# Patient Record
Sex: Female | Born: 1985 | Race: White | Hispanic: No | Marital: Married | Smoking: Former smoker
Health system: Southern US, Community
[De-identification: ages and names within clinical notes are randomized; demographics above are authoritative.]

## PROBLEM LIST (undated history)

## (undated) DIAGNOSIS — M359 Systemic involvement of connective tissue, unspecified: Secondary | ICD-10-CM

## (undated) DIAGNOSIS — D849 Immunodeficiency, unspecified: Secondary | ICD-10-CM

## (undated) DIAGNOSIS — I10 Essential (primary) hypertension: Secondary | ICD-10-CM

## (undated) HISTORY — DX: Systemic involvement of connective tissue, unspecified: M35.9

## (undated) HISTORY — DX: Essential (primary) hypertension: I10

## (undated) HISTORY — PX: TYMPANOSTOMY TUBE PLACEMENT: SHX32

---

## 2008-11-24 ENCOUNTER — Emergency Department (HOSPITAL_COMMUNITY): Admission: EM | Admit: 2008-11-24 | Discharge: 2008-11-24 | Payer: Self-pay | Admitting: Emergency Medicine

## 2008-12-22 ENCOUNTER — Emergency Department (HOSPITAL_COMMUNITY): Admission: EM | Admit: 2008-12-22 | Discharge: 2008-12-22 | Payer: Self-pay | Admitting: Emergency Medicine

## 2009-11-21 IMAGING — US US EXTREM LOW VENOUS*R*
1 series · 14 of 24 positions shown · non-contrast
Comparison: None.

CLINICAL DATA: Right leg pain



[Series 1: us extrem low venous*right* · 14 of 34 slices shown]
[im 1/34]
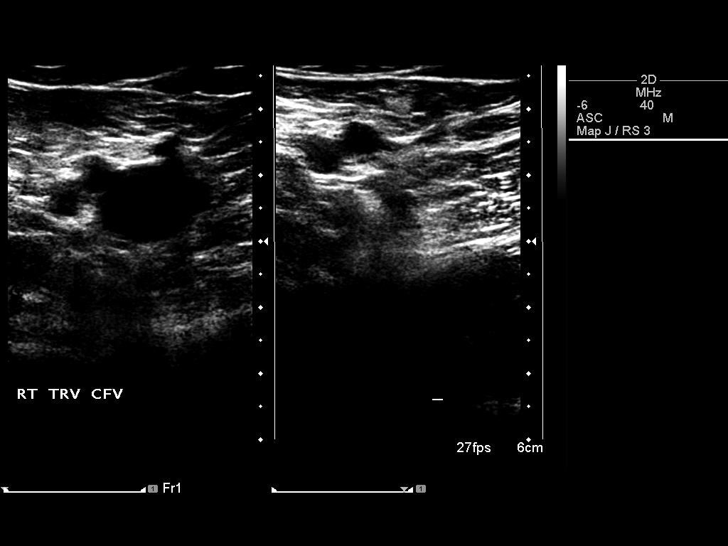
[im 3/34]
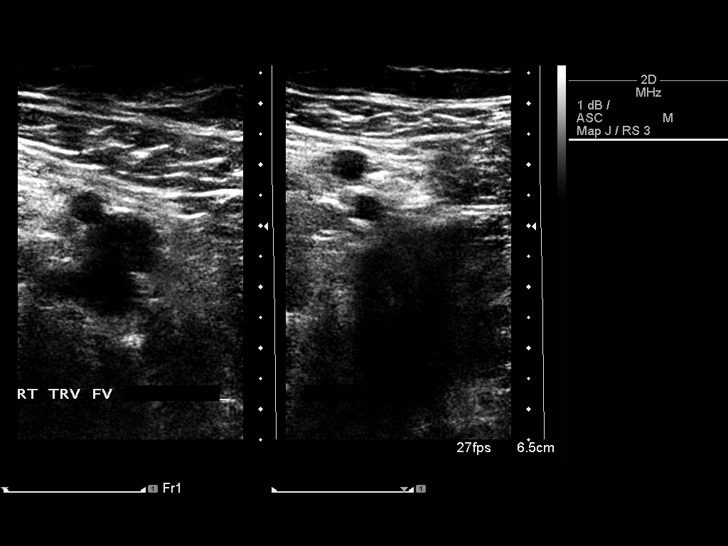
[im 6/34]
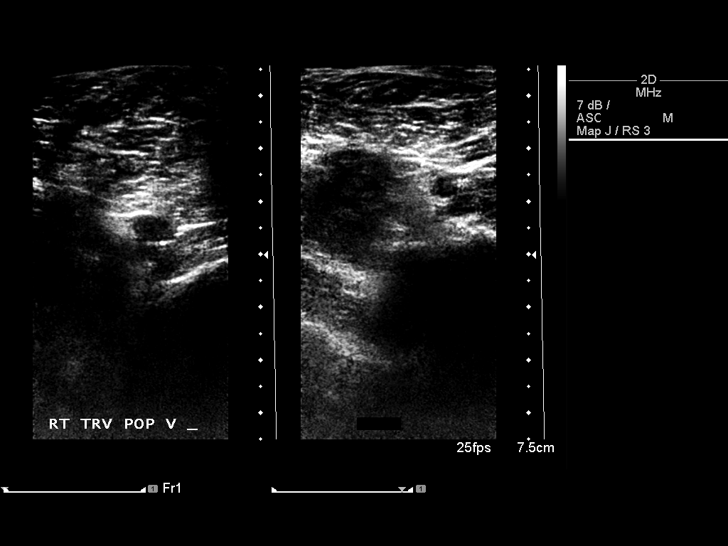
[im 9/34]
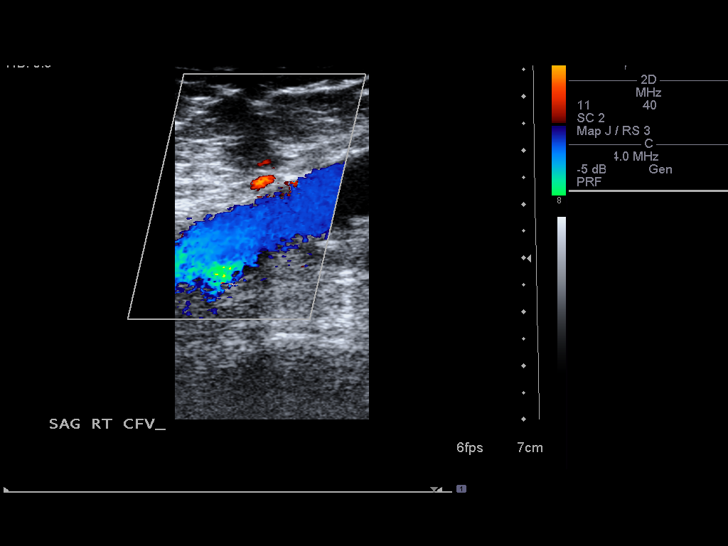
[im 11/34]
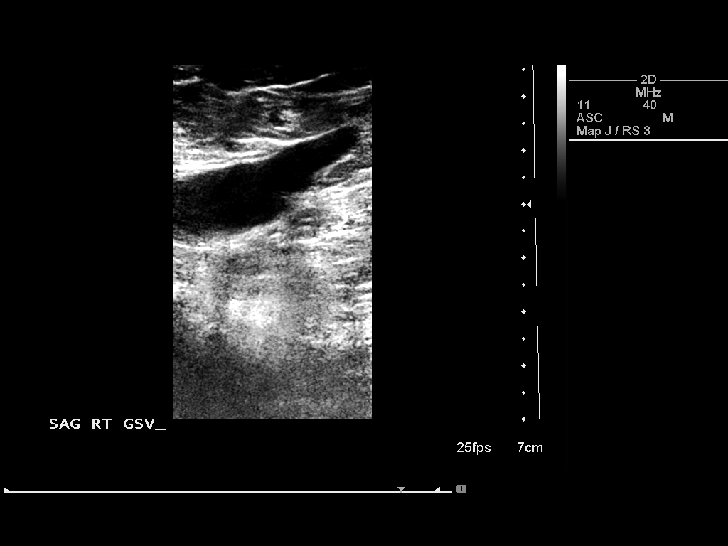
[im 13/34]
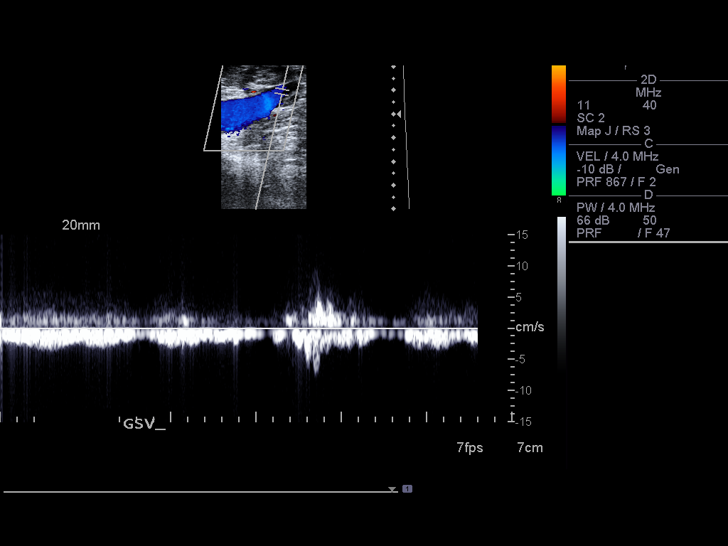
[im 16/34]
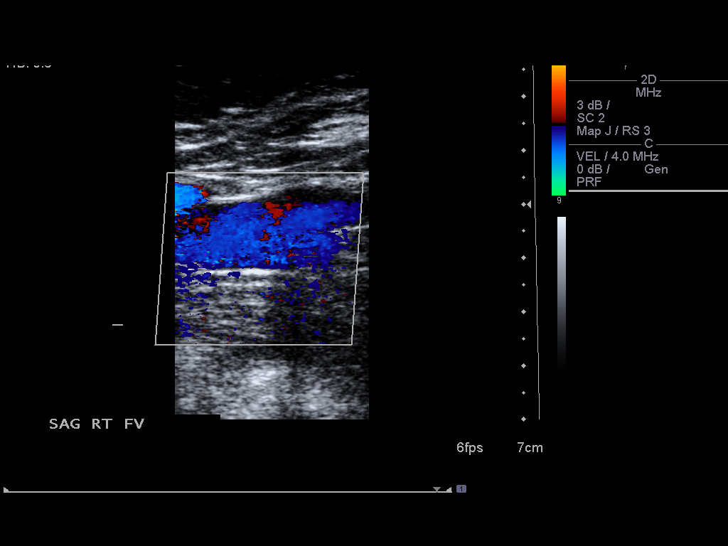
[im 18/34]
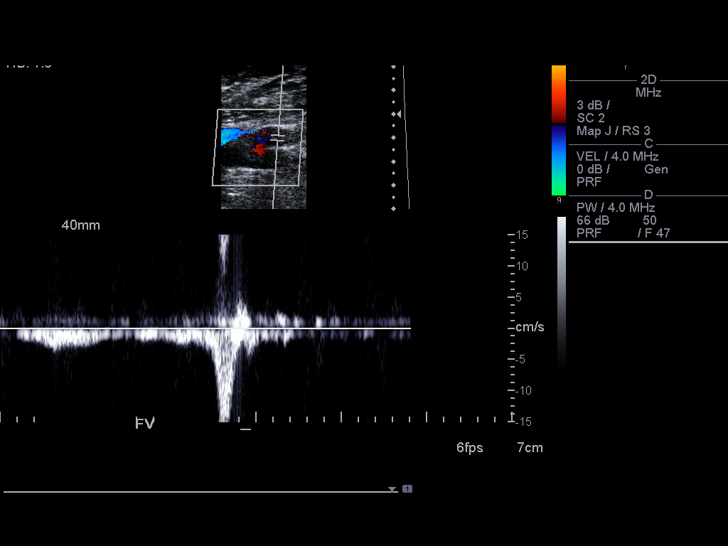
[im 21/34]
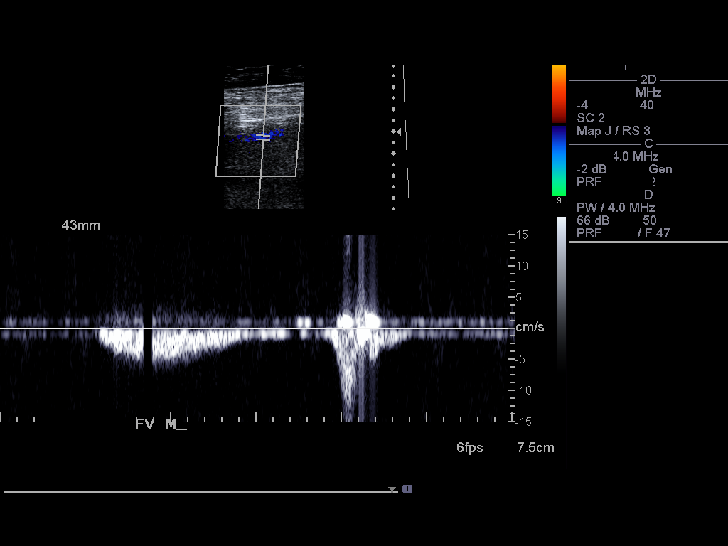
[im 23/34]
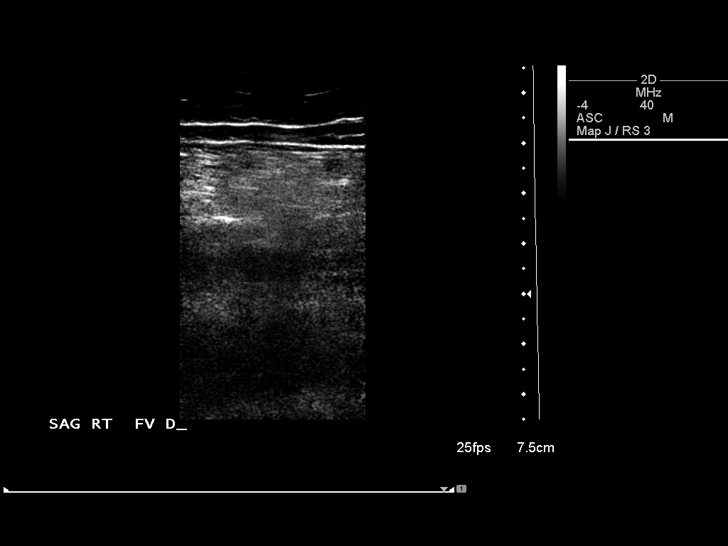
[im 26/34]
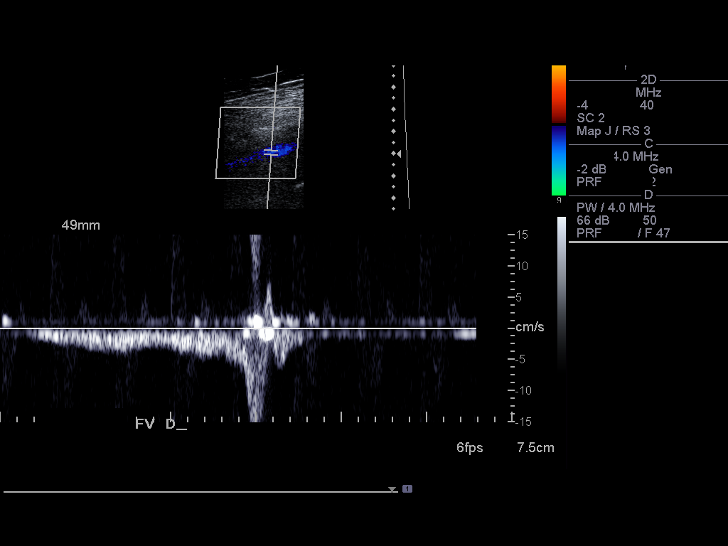
[im 28/34]
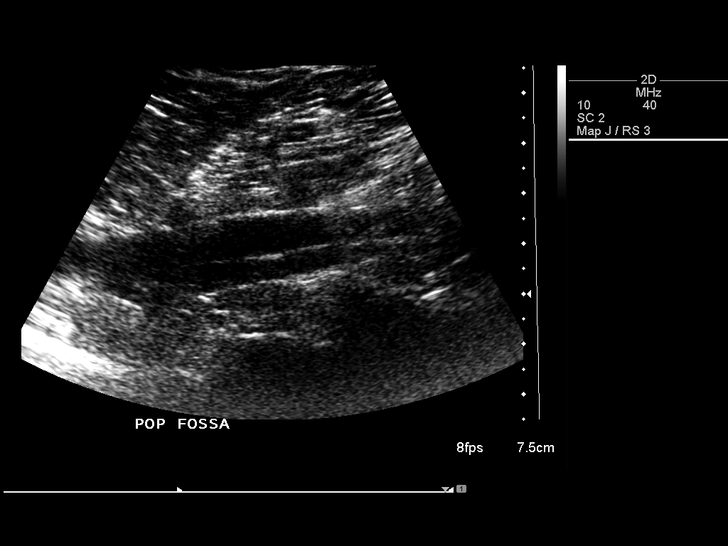
[im 31/34]
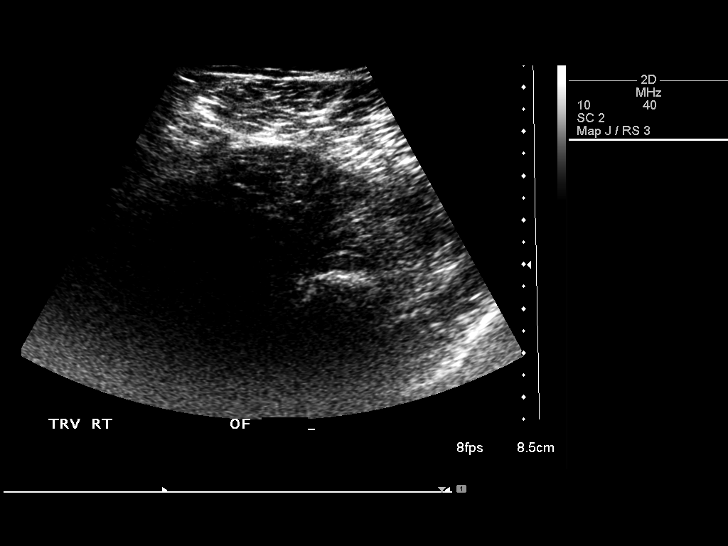
[im 34/34]
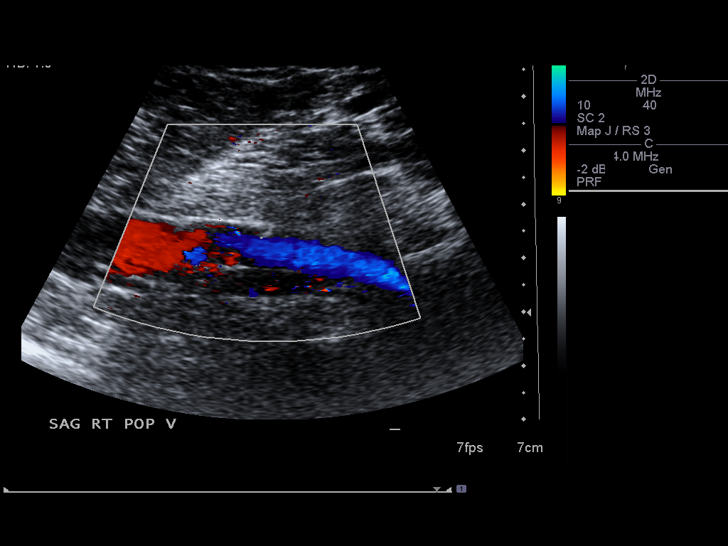

[14 of 24 positions shown; findings below may reference images not displayed]

FINDINGS: Normal compressibility of  the right common femoral,
superficial femoral, and popliteal veins is demonstrated, as well
as the visualized proximal calf veins.  No filling defects to
suggest DVT on grayscale or color Doppler imaging.  Doppler
waveforms show normal direction of venous flow, normal respiratory
phasicity and response to augmentation.
IMPRESSION: No evidence of  right lower extremity deep vein thrombosis.

REF:G1 DICTATED: 12/22/2008 [DATE]

## 2012-02-17 HISTORY — PX: ABLATION: SHX5711

## 2012-02-17 HISTORY — PX: TUBAL LIGATION: SHX77

## 2012-09-08 LAB — CBC WITH DIFFERENTIAL/PLATELET
ALT: 18 U/L (ref 7–35)
AST: 16 U/L

## 2012-09-24 ENCOUNTER — Encounter: Payer: Self-pay | Admitting: Gastroenterology

## 2012-09-24 ENCOUNTER — Ambulatory Visit (INDEPENDENT_AMBULATORY_CARE_PROVIDER_SITE_OTHER): Payer: Medicaid Other | Admitting: Gastroenterology

## 2012-09-24 VITALS — BP 122/89 | HR 92 | Temp 96.3°F | Ht 63.0 in | Wt 213.4 lb

## 2012-09-24 DIAGNOSIS — R109 Unspecified abdominal pain: Secondary | ICD-10-CM

## 2012-09-24 MED ORDER — ONDANSETRON 4 MG PO TBDP: 4.0000 mg | ORAL_TABLET | Freq: Three times a day (TID) | ORAL | Status: DC | PRN

## 2012-09-24 NOTE — Progress Notes (Signed)
Received labs from 09/14/12: WBC 11.6, Hgb 12.6, AP 103, AST 16, ALT 18, Tbili 0.2, Lipase 32

## 2012-09-24 NOTE — Progress Notes (Signed)
Faxed to PCP

## 2012-09-24 NOTE — Patient Instructions (Addendum)
We have set you up for an upper endoscopy with Dr. Darrick Penna tomorrow.  Do not eat solid food after midnight. May have sips of clear liquids tomorrow morning but NONE after 9am.  Seek medical attention if you have severe belly pain, vomiting blood, black/tarry stool, distended/tight belly.

## 2012-09-24 NOTE — Assessment & Plan Note (Addendum)
26 year old female with recent + H.pylori serology, with 5 days of Prevpac completed thus far. Notes worsening of upper abdominal pain, decreased ability to tolerate po. Notes pain even with water. No melena. 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 Question underlying ulcerative process in the setting of medications; no peritoneal signs on exam. Afebrile, VSS. Needs EGD in near future for further assessment. Instructed on signs/symptoms to report.   Proceed with upper endoscopy in the near future with Dr. Darrick Penna. The risks, benefits, and alternatives have been discussed in detail with patient. They have stated understanding and desire to proceed.  Zofran rx to pharmacy Obtaining labs from PCP: doubt hepatobiliary process (no CT or [place] on file, consider if negative EGD)

## 2012-09-24 NOTE — Progress Notes (Signed)
Referring Provider: Claggett, Autumn Patty, PA Primary Care Physician:  Rush Barer, [place] Primary Gastroenterologist:  Dr. Darrick Penna   Chief Complaint  Patient presents with  . Nausea    taking prevpak for H.Pylori  . Emesis    HPI:   26 year old female who presents today at the request of [facility] [place], [place]). Labs not available at time of visit, but note states +H.pylori serologies. Treatment with Prevpac began 5 days ago. Pt notes symptoms worsening. Pain X 3 weeks. As soon as eating, hits stomach and diffuse abdominal burning. +reflux. Decreased po intake to the point of even drinking water causing pain. Given GI cocktail today. Tried to eat banana today and was doubled over in pain. Trying to eat crackers, toast. Trying not to vomit. +nausea. Hates throwing up. Feels like lump in back of throat. 3 lbs wt loss.    Been on Prednisone X 6 months, has undifferentiated connective tissue disease. 4 weeks left of school till Christmas break. [employer] [facility] [employer]. Goes to [employer]. Has class on Monday and Wednesday. No melena. No diarrhea. +bloating.  101.6 yesterday, sometimes runs fever with flares of "lupus". Afebrile today, vitals stable.    Past Medical History  Diagnosis Date  . Undifferentiated connective tissue disease   . Hypertension     Past Surgical History  Procedure Date  . Tubal ligation April 2013  . Ablation April 2013  . Cesarean section May 2012    Current Outpatient Prescriptions  Medication Sig Dispense Refill  . cetirizine (ZYRTEC) 10 MG tablet Take 10 mg by mouth daily. As needed      . cyclobenzaprine (FLEXERIL) 10 MG tablet Take 10 mg by mouth 3 (three) times daily as needed. Only as needed      . folic acid (FOLVITE) 1 MG tablet Take 1 mg by mouth daily.      . hydroxychloroquine (PLAQUENIL) 200 MG tablet Take by mouth 2 (two) times daily.      Marland Kitchen ibuprofen (ADVIL,MOTRIN) 800 MG tablet Take 800 mg by mouth every 8 (eight)  hours as needed. Only as needed      . lisinopril (PRINIVIL,ZESTRIL) 10 MG tablet Take 10 mg by mouth daily.      . methotrexate 2.5 MG tablet Take by mouth 3 (three) times a week. Takes 6 tablets once weekly on Sunday      . metoprolol tartrate (LOPRESSOR) 25 MG tablet Take 25 mg by mouth 1 day or 1 dose.      . NON FORMULARY Prevpak as directed      . predniSONE (DELTASONE) 5 MG tablet Take 5 mg by mouth daily.      Marland Kitchen spironolactone (ALDACTONE) 25 MG tablet Take 25 mg by mouth daily.        Allergies as of 09/24/2012 - Review Complete 09/24/2012  Allergen Reaction Noted  . Diflucan (fluconazole) Anaphylaxis 09/24/2012  . Reglan (metoclopramide) Other (See Comments) 09/24/2012  . Sulfa antibiotics Hives 09/24/2012  . Topamax (topiramate) Itching and Other (See Comments) 09/24/2012    Family History  Problem Relation Age of Onset  . Colon cancer Neg Hx     History   Social History  . Marital Status: Single    Spouse Name: N/A    Number of Children: 4  . Years of Education: N/A   Occupational History  . Not on file.   Social History Main Topics  . Smoking status: Former Games developer  . Smokeless tobacco: Not on file  Comment: Quit in 2006  . Alcohol Use: No  . Drug Use: No  . Sexually Active: Yes    Birth Control/ Protection: Surgical   Other Topics Concern  . Not on file   Social History Narrative   4 children, ages 73, 69, 85, 7 months.     Review of Systems: Gen: +fever CV: Denies chest pain, heart palpitations, syncope, peripheral edema. Resp: Denies shortness of breath with rest, cough, wheezing GI: SEE HPI GU :decreased urine output MS: +joint pain  Derm: Denies rash, itching, dry skin Psych: Denies depression, anxiety, confusion or memory loss  Heme: Denies bruising, bleeding, and enlarged lymph nodes.  Physical Exam: BP 122/89  Pulse 92  Temp 96.3 F (35.7 C) (Tympanic)  Ht 5\' 3"  (1.6 m)  Wt 213 lb 6.4 oz (96.798 kg)  BMI 37.80 kg/m2  LMP  09/05/2012 General:   Alert and oriented. Well-developed, well-nourished, pleasant and cooperative. Head:  Normocephalic and atraumatic. Eyes:  Conjunctiva pink, sclera clear, no icterus.   Conjunctiva pink. Ears:  Normal auditory acuity. Nose:  No deformity, discharge,  or lesions. Mouth:  No deformity or lesions, mucosa pink and moist.  Neck:  Supple, without mass or thyromegaly. Lungs:  Clear to auscultation bilaterally, without wheezing, rales, or rhonchi.  Heart:  S1, S2 present without murmurs noted.  Abdomen:  +BS, soft, TTP diffusely but NO PERITONEAL SIGNS. non-distended. Without mass or HSM. No rebound or guarding. No hernias noted. Rectal:  Deferred  Msk:  Symmetrical without gross deformities. Normal posture. Pulses:  Normal pulses noted. Extremities:  Without clubbing or edema. Neurologic:  Alert and  oriented x4;  grossly normal neurologically. Skin:  Intact, warm and dry without significant lesions or rashes Cervical Nodes:  No significant cervical adenopathy. Psych:  Appears worried. Anxious about school, taking care of kids.

## 2012-09-25 ENCOUNTER — Ambulatory Visit (HOSPITAL_COMMUNITY)
Admission: RE | Admit: 2012-09-25 | Discharge: 2012-09-25 | Disposition: A | Discharge status: Home / Self Care | Payer: Medicaid Other | Source: Ambulatory Visit | Attending: Gastroenterology | Admitting: Gastroenterology

## 2012-09-25 ENCOUNTER — Encounter (HOSPITAL_COMMUNITY): Payer: Self-pay | Admitting: *Deleted

## 2012-09-25 ENCOUNTER — Encounter (HOSPITAL_COMMUNITY)
Admission: RE | Disposition: A | Discharge status: Home / Self Care | Payer: Self-pay | Source: Ambulatory Visit | Attending: Gastroenterology

## 2012-09-25 DIAGNOSIS — R1013 Epigastric pain: Secondary | ICD-10-CM

## 2012-09-25 DIAGNOSIS — I10 Essential (primary) hypertension: Secondary | ICD-10-CM | POA: Insufficient documentation

## 2012-09-25 DIAGNOSIS — K3189 Other diseases of stomach and duodenum: Secondary | ICD-10-CM | POA: Insufficient documentation

## 2012-09-25 DIAGNOSIS — R109 Unspecified abdominal pain: Secondary | ICD-10-CM

## 2012-09-25 DIAGNOSIS — K2960 Other gastritis without bleeding: Secondary | ICD-10-CM

## 2012-09-25 DIAGNOSIS — K2940 Chronic atrophic gastritis without bleeding: Secondary | ICD-10-CM | POA: Insufficient documentation

## 2012-09-25 HISTORY — PX: ESOPHAGOGASTRODUODENOSCOPY: SHX5428

## 2012-09-25 SURGERY — EGD (ESOPHAGOGASTRODUODENOSCOPY)
Anesthesia: Moderate Sedation

## 2012-09-25 MED ORDER — PROMETHAZINE HCL 25 MG/ML IJ SOLN
INTRAMUSCULAR | Status: AC
  Filled 2012-09-25: qty 1

## 2012-09-25 MED ORDER — MIDAZOLAM HCL 5 MG/5ML IJ SOLN
INTRAMUSCULAR | Status: AC
  Filled 2012-09-25: qty 10

## 2012-09-25 MED ORDER — SODIUM CHLORIDE 0.9 % IJ SOLN
INTRAMUSCULAR | Status: AC
  Filled 2012-09-25: qty 10

## 2012-09-25 MED ORDER — BUTAMBEN-TETRACAINE-BENZOCAINE 2-2-14 % EX AERO
INHALATION_SPRAY | CUTANEOUS | Status: DC | PRN
  Administered 2012-09-25: 2 via TOPICAL

## 2012-09-25 MED ORDER — STERILE WATER FOR IRRIGATION IR SOLN
Status: DC | PRN
  Administered 2012-09-25: 15:00:00

## 2012-09-25 MED ORDER — PROMETHAZINE HCL 25 MG/ML IJ SOLN
INTRAMUSCULAR | Status: DC | PRN
  Administered 2012-09-25: 12.5 mg via INTRAVENOUS

## 2012-09-25 MED ORDER — OMEPRAZOLE 20 MG PO CPDR: DELAYED_RELEASE_CAPSULE | ORAL | Status: DC

## 2012-09-25 MED ORDER — MIDAZOLAM HCL 5 MG/5ML IJ SOLN
INTRAMUSCULAR | Status: DC | PRN
  Administered 2012-09-25 (×3): 2 mg via INTRAVENOUS

## 2012-09-25 MED ORDER — ONDANSETRON 4 MG PO TBDP: ORAL_TABLET | ORAL | Status: DC

## 2012-09-25 MED ORDER — MEPERIDINE HCL 100 MG/ML IJ SOLN
INTRAMUSCULAR | Status: DC | PRN
  Administered 2012-09-25 (×2): 50 mg via INTRAVENOUS
  Administered 2012-09-25: 25 mg via INTRAVENOUS

## 2012-09-25 MED ORDER — SODIUM CHLORIDE 0.45 % IV SOLN
INTRAVENOUS | Status: DC
  Administered 2012-09-25: 1000 mL via INTRAVENOUS

## 2012-09-25 MED ORDER — MEPERIDINE HCL 100 MG/ML IJ SOLN
INTRAMUSCULAR | Status: AC
  Filled 2012-09-25: qty 2

## 2012-09-25 MED ORDER — PROMETHAZINE HCL 25 MG/ML IJ SOLN
12.5000 mg | Freq: Once | INTRAMUSCULAR | Status: AC
  Administered 2012-09-25: 12.5 mg via INTRAVENOUS

## 2012-09-25 NOTE — H&P (Signed)
Primary Care Physician:  Rush Barer, PA Primary Gastroenterologist:  Dr. Darrick Penna  Pre-Procedure History & Physical: HPI:  Teresa Berg is a 26 y.o. female here for DYSPEPSIA.   Past Medical History  Diagnosis Date  . Undifferentiated connective tissue disease   . Hypertension     Past Surgical History  Procedure Date  . Tubal ligation April 2013  . Ablation April 2013  . Cesarean section May 2012    Prior to Admission medications   Medication Sig Start Date End Date Taking? Authorizing Provider  cetirizine (ZYRTEC) 10 MG tablet Take 10 mg by mouth daily. As needed   Yes Historical Provider, MD  folic acid (FOLVITE) 1 MG tablet Take 1 mg by mouth daily.   Yes Historical Provider, MD  hydroxychloroquine (PLAQUENIL) 200 MG tablet Take by mouth 2 (two) times daily.   Yes Historical Provider, MD  ibuprofen (ADVIL,MOTRIN) 800 MG tablet Take 800 mg by mouth every 8 (eight) hours as needed. Only as needed   Yes Historical Provider, MD  lisinopril (PRINIVIL,ZESTRIL) 10 MG tablet Take 10 mg by mouth daily.   Yes Historical Provider, MD  methotrexate 2.5 MG tablet Take by mouth 3 (three) times a week. Takes 6 tablets once weekly on Sunday   Yes Historical Provider, MD  metoprolol tartrate (LOPRESSOR) 25 MG tablet Take 25 mg by mouth 1 day or 1 dose.   Yes Historical Provider, MD  NON FORMULARY Prevpak as directed   Yes Historical Provider, MD  predniSONE (DELTASONE) 5 MG tablet Take 5 mg by mouth daily.   Yes Historical Provider, MD  spironolactone (ALDACTONE) 25 MG tablet Take 25 mg by mouth daily.   Yes Historical Provider, MD  cyclobenzaprine (FLEXERIL) 10 MG tablet Take 10 mg by mouth 3 (three) times daily as needed. Only as needed    Historical Provider, MD  ondansetron (ZOFRAN ODT) 4 MG disintegrating tablet Take 1 tablet (4 mg total) by mouth every 8 (eight) hours as needed for nausea. 09/24/12   Nira Retort, NP    Allergies as of 09/24/2012 - Review Complete 09/24/2012    Allergen Reaction Noted  . Diflucan (fluconazole) Anaphylaxis 09/24/2012  . Reglan (metoclopramide) Other (See Comments) 09/24/2012  . Sulfa antibiotics Hives 09/24/2012  . Topamax (topiramate) Itching and Other (See Comments) 09/24/2012    Family History  Problem Relation Age of Onset  . Colon cancer Neg Hx     History   Social History  . Marital Status: Single    Spouse Name: N/A    Number of Children: 4  . Years of Education: N/A   Occupational History  . Not on file.   Social History Main Topics  . Smoking status: Former Games developer  . Smokeless tobacco: Not on file     Comment: Quit in 2006  . Alcohol Use: No  . Drug Use: No  . Sexually Active: Yes    Birth Control/ Protection: Surgical   Other Topics Concern  . Not on file   Social History Narrative   4 children, ages 24, 37, 83, 37 months.     Review of Systems: See HPI, otherwise negative ROS   Physical Exam: BP 123/80  Pulse 90  Temp 98 F (36.7 C) (Oral)  Resp 18  SpO2 96%  LMP 09/05/2012 General:   Alert,  pleasant and cooperative in NAD Head:  Normocephalic and atraumatic. Neck:  Supple; Lungs:  Clear throughout to auscultation.    Heart:  Regular rate and rhythm. Abdomen:  Soft, nontender and nondistended. Normal bowel sounds, without guarding, and without rebound.   Neurologic:  Alert and  oriented x4;  grossly normal neurologically.  Impression/Plan:     DYSPEPSIA  PLAN:  EGD TODAY

## 2012-09-25 NOTE — Op Note (Signed)
[facility] [address] [place], [address]   ENDOSCOPY PROCEDURE REPORT  PATIENT: Teresa Berg, Teresa Berg  MR#: [id number] BIRTHDATE: 04-Jan-1986 , 26  yrs. old GENDER: Female  ENDOSCOPIST: Jonette Eva, MD REFERRED XB:JYNW Claggett, PA-C  PROCEDURE DATE: 09/25/2012 PROCEDURE:   EGD w/ biopsy  INDICATIONS:dyspepsia. MEDICATIONS: Demerol 125 mg IV, Versed 6 mg IV, and Promethazine (Phenergan) 12.5mg  IV TOPICAL ANESTHETIC:   Cetacaine Spray  DESCRIPTION OF PROCEDURE:     Physical exam was performed.  Informed consent was obtained from the patient after explaining the benefits, risks, and alternatives to the procedure.  The patient was connected to the monitor and placed in the left lateral position.  Continuous oxygen was provided by nasal cannula and IV medicine administered through an indwelling cannula.  After administration of sedation, the patients esophagus was intubated and the EG-[id number] ([id number])  endoscope was advanced under direct visualization to the second portion of the duodenum.  The scope was removed slowly by carefully examining the color, texture, anatomy, and integrity of the mucosa on the way out.  The patient was recovered in endoscopy and discharged home in satisfactory condition.      ESOPHAGUS: The mucosa of the esophagus appeared normal.  STOMACH: Mild non-erosive gastritis (inflammation) was found in the gastric antrum.  Multiple biopsies were performed.  DUODENUM: The duodenal mucosa showed no abnormalities in the bulb and second portion of the duodenum.  Cold forcep biopsies were taken in the second portion.  COMPLICATIONS:   None  ENDOSCOPIC IMPRESSION: 1.   The mucosa of the esophagus appeared normal 2.   Non-erosive gastritis (inflammation) was found in the gastric antrum; multiple biopsies 3.   The duodenal mucosa showed no abnormalities in the bulb and second portion of the duodenum  RECOMMENDATIONS: COMPLETE  PREKPAK.  AFTRE THE PREPAK IS COMPLETE, TAKE OMEPRAZOLE 30 MINUTES PRIOR TO MEALS TWICE DAILY FOR 3 MOS THEN ONCE DAILY FOREVER.  AVOID TRIGGERS FOR GASTRITIS.  FOLLOW A soft mechanical/LOW FAT DIET.  FOLLOW UP IN 4 MOS.   REPEAT EXAM:   _______________________________ Rosalie DoctorJonette Eva, MD 09/25/2012 4:01 PM       PATIENT NAME:  Teresa, Berg MR#: [id number]

## 2012-09-26 NOTE — Progress Notes (Signed)
REVIEWED.  

## 2012-09-30 ENCOUNTER — Telehealth: Payer: Self-pay | Admitting: *Deleted

## 2012-09-30 ENCOUNTER — Other Ambulatory Visit: Payer: Self-pay

## 2012-09-30 ENCOUNTER — Telehealth: Payer: Self-pay | Admitting: Gastroenterology

## 2012-09-30 ENCOUNTER — Encounter (HOSPITAL_COMMUNITY): Payer: Self-pay | Admitting: Gastroenterology

## 2012-09-30 DIAGNOSIS — R109 Unspecified abdominal pain: Secondary | ICD-10-CM

## 2012-09-30 NOTE — Telephone Encounter (Signed)
Path faxed to PCP, appt made  

## 2012-09-30 NOTE — Telephone Encounter (Signed)
Called pt. She said that Monday she had nausea very bad. Yesterday, the nausea was better, she was able to eat some, but she had diarrhea x 5. Today she woke up and vomited first thing and it has a bile taste. She has been taking the Zofran qid, but it does not help. ( EGD was done on 09/25/2012) . She has a few more days of the Prevpak.  Please advise!

## 2012-09-30 NOTE — Telephone Encounter (Signed)
Lab order and container ready for pick up.

## 2012-09-30 NOTE — Telephone Encounter (Addendum)
PLEASE CALL PT.  HER NVD MAY BE DUET TO A VIRUS OR THE PREVPAK. SHE SHOULD STOP TAKING THE PREVPAK. FOLLOW A FULL LIQUID DIET FOR 3 DAYS. CALL IN 3 DAYS IF HER SX ARE NOT IMPROVED. IF SHE IS UNABLE TO KEEP DOWN LIQUIDS SHE SHOULD GO TO THE ED. OPV NEXT MON OR TUE W/ AS.  SHE SHOULD HAVE  A H PYLORI STOOL ANTIGEN DONE ON 6 WEEKS.

## 2012-09-30 NOTE — Telephone Encounter (Signed)
Teresa Berg called today. She is still experiencing vomiting and is having a hard time keeping food down. She would like to know what we can do to help her. Please follow up.

## 2012-09-30 NOTE — Telephone Encounter (Signed)
LMOM to call.

## 2012-09-30 NOTE — Telephone Encounter (Signed)
appt made for next Tuesday  °

## 2012-09-30 NOTE — Telephone Encounter (Signed)
Pt returned call and was informed. I told her she can pick up the order/container for the H.pylori stool antigen when she comes in for OV next week.

## 2012-09-30 NOTE — Telephone Encounter (Addendum)
Please call pt. HER stomach Bx shows gastritis. IT DID NOT SHOW H PYLORI.   TAKE ZOFRAN 30 MINUTES PRIOR TO MEALS AND AT BEDTIME.  HOLD PREVPAK & START OMEPRAZOLE 30 MINUTES PRIOR TO MEALS TWICE DAILY FOR 3 MOS THEN ONCE DAILY FOREVER.  AVOID TRIGGERS FOR GASTRITIS.   FOLLOW A soft mechanical/LOW FAT DIET.   FOLLOW UP IN NEXT WEEK E30. WE WILL CANCEL H PYLORI STOOL ANTIGEN SINCE BX IS NEGATIVE.

## 2012-10-01 NOTE — Telephone Encounter (Signed)
Called and informed pt.  

## 2012-10-05 ENCOUNTER — Encounter: Payer: Self-pay | Admitting: Gastroenterology

## 2012-10-06 ENCOUNTER — Ambulatory Visit (INDEPENDENT_AMBULATORY_CARE_PROVIDER_SITE_OTHER): Payer: Medicaid Other | Admitting: Gastroenterology

## 2012-10-06 ENCOUNTER — Encounter: Payer: Self-pay | Admitting: Gastroenterology

## 2012-10-06 ENCOUNTER — Ambulatory Visit: Payer: Medicaid Other | Admitting: Gastroenterology

## 2012-10-06 VITALS — BP 113/81 | HR 93 | Temp 98.0°F | Ht 63.0 in | Wt 208.2 lb

## 2012-10-06 DIAGNOSIS — R109 Unspecified abdominal pain: Secondary | ICD-10-CM

## 2012-10-06 NOTE — Patient Instructions (Addendum)
Continue Omeprazole twice a day for a total of 3 months, then take daily indefinitely.  Contact us if you have any further issues. We will see you back as needed.

## 2012-10-06 NOTE — Progress Notes (Signed)
Referring Provider: Claggett, Autumn Patty, PA Primary Care Physician:  Rush Barer, PA Primary GI: Dr. Darrick Penna   Chief Complaint  Patient presents with  . Follow-up    HPI:   26 year old female here in f/u after EGD with Dr. Darrick Penna. Has historically positive H.pylori serology without improvement after empiric treatment with Prevpac. EGD performed due to symptoms, with findings of chronic gastritis, negative H.pylori. Prevpac stopped after path report returned.   Still taking Omeprazole BID. Feels significantly better since stopping abx. Took Zofran yesterday right before lunch. Feels at "80%" now. Doing well without complaints. 59 month old son present with her today.   Past Medical History  Diagnosis Date  . Undifferentiated connective tissue disease   . Hypertension     Past Surgical History  Procedure Date  . Tubal ligation April 2013  . Ablation April 2013  . Cesarean section May 2012  . Esophagogastroduodenoscopy 09/25/2012    SLF: The mucosa of the esophagus appeared normal/ Non-erosive gastritis (inflammation) was found in the gastric antrum; multiple biopsies /The duodenal mucosa showed no abnormalities in the bulb and second portion of the duodenum; PATH: benign small bowel, chronic focally active gastritis    Current Outpatient Prescriptions  Medication Sig Dispense Refill  . cetirizine (ZYRTEC) 10 MG tablet Take 10 mg by mouth daily. As needed      . cyclobenzaprine (FLEXERIL) 10 MG tablet Take 10 mg by mouth 3 (three) times daily as needed. Only as needed      . folic acid (FOLVITE) 1 MG tablet Take 1 mg by mouth daily.      . hydroxychloroquine (PLAQUENIL) 200 MG tablet Take by mouth 2 (two) times daily.      Marland Kitchen ibuprofen (ADVIL,MOTRIN) 800 MG tablet Take 800 mg by mouth every 8 (eight) hours as needed. Only as needed      . lisinopril (PRINIVIL,ZESTRIL) 10 MG tablet Take 10 mg by mouth daily.      . methotrexate 2.5 MG tablet Take by mouth. Takes 6 tablets once weekly on  Sunday      . metoprolol tartrate (LOPRESSOR) 25 MG tablet Take 25 mg by mouth 1 day or 1 dose.      Marland Kitchen omeprazole (PRILOSEC) 20 MG capsule 20 mg 2 (two) times daily. 1 po bid 30 minutes before meals for 3 mos then once daily FOREVER      . ondansetron (ZOFRAN ODT) 4 MG disintegrating tablet 1 po 30 minutes prior to meals and at bedtime  60 tablet  1  . predniSONE (DELTASONE) 5 MG tablet Take 5 mg by mouth daily.      Marland Kitchen spironolactone (ALDACTONE) 25 MG tablet Take 25 mg by mouth daily.      . [DISCONTINUED] omeprazole (PRILOSEC) 20 MG capsule 1 po bid 30 minutes before meals for 3 mos then once daily FOREVER  62 capsule  11  . NON FORMULARY Prevpak as directed        Allergies as of 10/06/2012 - Review Complete 10/06/2012  Allergen Reaction Noted  . Diflucan (fluconazole) Anaphylaxis 09/24/2012  . Reglan (metoclopramide) Other (See Comments) 09/24/2012  . Sulfa antibiotics Hives 09/24/2012  . Topamax (topiramate) Itching and Other (See Comments) 09/24/2012    Family History  Problem Relation Age of Onset  . Colon cancer Neg Hx     History   Social History  . Marital Status: Single    Spouse Name: N/A    Number of Children: 4  . Years of Education: N/A  Social History Main Topics  . Smoking status: Former Games developer  . Smokeless tobacco: Not on file     Comment: Quit in 2006  . Alcohol Use: No  . Drug Use: No  . Sexually Active: Yes    Birth Control/ Protection: Surgical   Other Topics Concern  . Not on file   Social History Narrative   4 children, ages 14, 60, 63, 61 months.     Review of Systems: Gen: Denies fever, chills, anorexia. Denies fatigue, weakness, weight loss.  CV: Denies chest pain, palpitations, syncope, peripheral edema, and claudication. Resp: Denies dyspnea at rest, cough, wheezing, coughing up blood, and pleurisy. GI: Denies vomiting blood, jaundice, and fecal incontinence.   Denies dysphagia or odynophagia. Derm: Denies rash, itching, dry skin Psych:  Denies depression, anxiety, memory loss, confusion. No homicidal or suicidal ideation.  Heme: Denies bruising, bleeding, and enlarged lymph nodes.  Physical Exam: BP 113/81  Pulse 93  Temp 98 F (36.7 C) (Tympanic)  Ht 5\' 3"  (1.6 m)  Wt 208 lb 3.2 oz (94.439 kg)  BMI 36.88 kg/m2  LMP 09/30/2012 General:   Alert and oriented. No distress noted. Pleasant and cooperative.  Head:  Normocephalic and atraumatic. Eyes:  Conjuctiva clear without scleral icterus. Heart:  S1, S2 present without murmurs, rubs, or gallops. Regular rate and rhythm. Abdomen:  +BS, soft, non-tender and non-distended. No rebound or guarding. No HSM or masses noted. Msk:  Symmetrical without gross deformities. Normal posture. Extremities:  Without edema. Neurologic:  Alert and  oriented x4;  grossly normal neurologically. Skin:  Intact without significant lesions or rashes. Psych:  Alert and cooperative. Normal mood and affect.

## 2012-10-07 NOTE — Assessment & Plan Note (Signed)
26 year old female with resolution of abdominal pain after Prilosec BID. EGD with findings of chronic gastritis, NEGATIVE H.pylori (+ serum H.pylori). No need for stool antigen at this time. Pt has significantly improved. I have provided a letter for her professor to excuse her absence from class last week due to acute illness. She should continue Prilosec BID X 3 months then daily indefinitely. Return prn.

## 2012-10-08 NOTE — Progress Notes (Signed)
Faxed to PCP

## 2012-12-14 NOTE — Progress Notes (Signed)
REVIEWED.  

## 2012-12-16 ENCOUNTER — Emergency Department (HOSPITAL_COMMUNITY)
Admission: EM | Admit: 2012-12-16 | Discharge: 2012-12-16 | Disposition: A | Discharge status: Home / Self Care | Payer: Medicaid Other | Attending: Emergency Medicine | Admitting: Emergency Medicine

## 2012-12-16 ENCOUNTER — Encounter (HOSPITAL_COMMUNITY): Payer: Self-pay | Admitting: Emergency Medicine

## 2012-12-16 DIAGNOSIS — Z792 Long term (current) use of antibiotics: Secondary | ICD-10-CM | POA: Insufficient documentation

## 2012-12-16 DIAGNOSIS — Z79899 Other long term (current) drug therapy: Secondary | ICD-10-CM | POA: Insufficient documentation

## 2012-12-16 DIAGNOSIS — Z87891 Personal history of nicotine dependence: Secondary | ICD-10-CM | POA: Insufficient documentation

## 2012-12-16 DIAGNOSIS — L0390 Cellulitis, unspecified: Secondary | ICD-10-CM

## 2012-12-16 DIAGNOSIS — N760 Acute vaginitis: Secondary | ICD-10-CM | POA: Insufficient documentation

## 2012-12-16 DIAGNOSIS — M359 Systemic involvement of connective tissue, unspecified: Secondary | ICD-10-CM | POA: Insufficient documentation

## 2012-12-16 DIAGNOSIS — I10 Essential (primary) hypertension: Secondary | ICD-10-CM | POA: Insufficient documentation

## 2012-12-16 DIAGNOSIS — Z791 Long term (current) use of non-steroidal anti-inflammatories (NSAID): Secondary | ICD-10-CM | POA: Insufficient documentation

## 2012-12-16 DIAGNOSIS — R1110 Vomiting, unspecified: Secondary | ICD-10-CM | POA: Insufficient documentation

## 2012-12-16 MED ORDER — MORPHINE SULFATE 4 MG/ML IJ SOLN
4.0000 mg | Freq: Once | INTRAMUSCULAR | Status: AC
  Administered 2012-12-16: 4 mg via INTRAVENOUS
  Filled 2012-12-16: qty 1

## 2012-12-16 MED ORDER — PROMETHAZINE HCL 25 MG RE SUPP: 25.0000 mg | Freq: Four times a day (QID) | RECTAL | Status: DC | PRN

## 2012-12-16 MED ORDER — VANCOMYCIN HCL IN DEXTROSE 1-5 GM/200ML-% IV SOLN
1000.0000 mg | Freq: Once | INTRAVENOUS | Status: AC
  Administered 2012-12-16: 1000 mg via INTRAVENOUS
  Filled 2012-12-16: qty 200

## 2012-12-16 MED ORDER — PROMETHAZINE HCL 25 MG/ML IJ SOLN
25.0000 mg | Freq: Once | INTRAMUSCULAR | Status: AC
  Administered 2012-12-16: 25 mg via INTRAVENOUS
  Filled 2012-12-16: qty 1

## 2012-12-16 NOTE — ED Notes (Signed)
Pt c/o scalp itching, EDP made aware and ordered to slow down rate, rate changed to 175cc/hr, pt states that itching has decreased

## 2012-12-16 NOTE — ED Notes (Addendum)
Total of 3 boils to vaginal area. Goes to [facility] for other hx. Was never given anitbiotics. States was just one but total of 3 now. One busted the other day. Has seen ob x 3 for this and states has done nothing but give vicodin. Seen [facility] yesterday, area was cultured and was given clindimycin. Has taken it x 3 and vomited 30 min after each time. Percocet given yesterday and it helps better. Took phenergen with clindimycin but still vomited.

## 2012-12-16 NOTE — ED Provider Notes (Signed)
History  This chart was scribed for Teresa Racer, MD by Bennett Scrape, ED Scribe. This patient was seen in room APA11/APA11 and the patient's care was started at 12:48 PM.  CSN: [id number]  Arrival date & time 12/16/12  1230   First MD Initiated Contact with Patient 12/16/12 1248      Chief Complaint  Patient presents with  . Abscess     Patient is a 27 y.o. female presenting with vomiting. The history is provided by the patient. No language interpreter was used.  Emesis  This is a new problem. The current episode started yesterday. The problem occurs 2 to 4 times per day. The problem has not changed since onset.The emesis has an appearance of stomach contents. There has been no fever. Pertinent negatives include no abdominal pain, no chills, no diarrhea and no fever.    Teresa Berg is a 27 y.o. female who presents to the Emergency Department complaining of 3 episodes of emesis approximately 30 minutes after taking her Clindamycin. Pt states that she was seen by her PCP yesterday for 3 boils to her vaginal area and states that she has one lanced and had a culture taken. She was prescribed Vicodin, Phenergan and Clindamycin, which pt states is her first time taking Clindamycin. Pt states that took Phenergan 30 minutes prior to taking the Clindamycin but still vomited the Clindamycin up. She denies having prior episodes of similar symptoms and she denies having a h/o abscesses. She reports that she is here today for a different type of antibiotic. She denies abdominal pain, fevers, and hematemesis as associated symptoms. She has a h/o HTN and is a former smoker but denies alcohol use.  Past Medical History  Diagnosis Date  . Undifferentiated connective tissue disease   . Hypertension     Past Surgical History  Procedure Date  . Tubal ligation April 2013  . Ablation April 2013  . Cesarean section May 2012  . Esophagogastroduodenoscopy 09/25/2012    SLF: The mucosa of the  esophagus appeared normal/ Non-erosive gastritis (inflammation) was found in the gastric antrum; multiple biopsies /The duodenal mucosa showed no abnormalities in the bulb and second portion of the duodenum; PATH: benign small bowel, chronic focally active gastritis    Family History  Problem Relation Age of Onset  . Colon cancer Neg Hx     History  Substance Use Topics  . Smoking status: Former Games developer  . Smokeless tobacco: Not on file     Comment: Quit in 2006  . Alcohol Use: No    No OB history provided.  Review of Systems  Constitutional: Negative for fever and chills.  Gastrointestinal: Positive for vomiting. Negative for abdominal pain and diarrhea.  Skin: Positive for wound (3 abscesses to vaginal area).  All other systems reviewed and are negative.    Allergies  Diflucan; Reglan; Sulfa antibiotics; Hydrocodone; and Topamax  Home Medications   Current Outpatient Rx  Name  Route  Sig  Dispense  Refill  . CETIRIZINE HCL 10 MG PO TABS   Oral   Take 10 mg by mouth daily. As needed         . CLINDAMYCIN HCL 300 MG PO CAPS   Oral   Take 300 mg by mouth 3 (three) times daily. Started on 12/15/2012         . FOLIC ACID 1 MG PO TABS   Oral   Take 1 mg by mouth daily.         Marland Kitchen  HYDROXYCHLOROQUINE SULFATE 200 MG PO TABS   Oral   Take 200 mg by mouth 2 (two) times daily.          . IBUPROFEN 200 MG PO TABS   Oral   Take 600 mg by mouth every 6 (six) hours as needed. pain         . LISINOPRIL 10 MG PO TABS   Oral   Take 10 mg by mouth daily.         Marland Kitchen METOPROLOL TARTRATE 25 MG PO TABS   Oral   Take 25 mg by mouth daily.          Marland Kitchen OMEPRAZOLE 20 MG PO CPDR   Oral   Take 20 mg by mouth daily. 1 po bid 30 minutes before meals for 3 mos then once daily FOREVER         . OXYCODONE-ACETAMINOPHEN 5-325 MG PO TABS   Oral   Take 1 tablet by mouth every 4 (four) hours as needed. pain         . PREDNISONE 5 MG PO TABS   Oral   Take 10 mg by mouth  daily.          Marland Kitchen SPIRONOLACTONE 25 MG PO TABS   Oral   Take 25 mg by mouth daily.         . CYCLOBENZAPRINE HCL 10 MG PO TABS   Oral   Take 10 mg by mouth 3 (three) times daily as needed. Only as needed muscle spasm         . METHOTREXATE SODIUM 2.5 MG PO TABS   Oral   Take 15 mg by mouth once a week. Takes 6 tablets once weekly on Monday         . PROMETHAZINE HCL 25 MG RE SUPP   Rectal   Place 1 suppository (25 mg total) rectally every 6 (six) hours as needed for nausea.   12 each   0     Triage Vitals: BP 119/73  Pulse 98  Temp 98.8 F (37.1 C) (Oral)  Resp 20  Ht 5\' 3"  (1.6 m)  Wt 211 lb (95.709 kg)  BMI 37.38 kg/m2  SpO2 97%  LMP 12/16/2012  Physical Exam  Nursing note and vitals reviewed. Constitutional: She is oriented to person, place, and time. She appears well-developed and well-nourished. No distress.  HENT:  Head: Normocephalic and atraumatic.  Eyes: EOM are normal.  Neck: Neck supple. No tracheal deviation present.  Cardiovascular: Normal rate.   Pulmonary/Chest: Effort normal. No respiratory distress.  Abdominal: Soft. There is no tenderness.  Genitourinary:       Small area of erythema to right labia draining a small amount of purulent discharge, no fluctuance or masses, 3 cm area of erythema to the left inguinal area, no underlying masses or fluctuance, Chaperone present  Musculoskeletal: Normal range of motion.  Neurological: She is alert and oriented to person, place, and time.  Skin: Skin is warm and dry.  Psychiatric: She has a normal mood and affect. Her behavior is normal.    ED Course  Procedures (including critical care time)  DIAGNOSTIC STUDIES: Oxygen Saturation is 97% on room air, adequate by my interpretation.    COORDINATION OF CARE: 1:05 PM-Discussed treatment plan which includes IV antibiotics and pain control with pt at bedside and pt agreed to plan.   1:30 PM- Ordered 4 mg morphine injection, 25 mg Phenergan  injection and IV vancocin  Labs Reviewed - No  data to display No results found.   1. Cellulitis       MDM  I personally performed the services described in this documentation, which was scribed in my presence. The recorded information has been reviewed and is accurate.  Advised to return to ED if unable to keep antibiotics down or for worsening symptoms.   Teresa Racer, MD 12/16/12 1455

## 2012-12-17 ENCOUNTER — Emergency Department (HOSPITAL_COMMUNITY)
Admission: EM | Admit: 2012-12-17 | Discharge: 2012-12-17 | Disposition: A | Discharge status: Home / Self Care | Payer: Medicaid Other | Attending: Emergency Medicine | Admitting: Emergency Medicine

## 2012-12-17 ENCOUNTER — Encounter (HOSPITAL_COMMUNITY): Payer: Self-pay

## 2012-12-17 DIAGNOSIS — M549 Dorsalgia, unspecified: Secondary | ICD-10-CM | POA: Insufficient documentation

## 2012-12-17 DIAGNOSIS — L02419 Cutaneous abscess of limb, unspecified: Secondary | ICD-10-CM | POA: Insufficient documentation

## 2012-12-17 DIAGNOSIS — Z3202 Encounter for pregnancy test, result negative: Secondary | ICD-10-CM | POA: Insufficient documentation

## 2012-12-17 DIAGNOSIS — Z79899 Other long term (current) drug therapy: Secondary | ICD-10-CM | POA: Insufficient documentation

## 2012-12-17 DIAGNOSIS — D849 Immunodeficiency, unspecified: Secondary | ICD-10-CM | POA: Insufficient documentation

## 2012-12-17 DIAGNOSIS — I10 Essential (primary) hypertension: Secondary | ICD-10-CM | POA: Insufficient documentation

## 2012-12-17 DIAGNOSIS — L03119 Cellulitis of unspecified part of limb: Secondary | ICD-10-CM | POA: Insufficient documentation

## 2012-12-17 DIAGNOSIS — Z87891 Personal history of nicotine dependence: Secondary | ICD-10-CM | POA: Insufficient documentation

## 2012-12-17 DIAGNOSIS — R1110 Vomiting, unspecified: Secondary | ICD-10-CM | POA: Insufficient documentation

## 2012-12-17 DIAGNOSIS — L02416 Cutaneous abscess of left lower limb: Secondary | ICD-10-CM

## 2012-12-17 HISTORY — DX: Immunodeficiency, unspecified: D84.9

## 2012-12-17 LAB — BASIC METABOLIC PANEL
GFR calc Af Amer: >90 mL/min (ref >90)
GFR calc non Af Amer: >90 mL/min (ref >90)
Potassium: 4.4 mEq/L (ref 3.5–5.1)
Sodium: 136 mEq/L (ref 135–145)

## 2012-12-17 LAB — CBC WITH DIFFERENTIAL/PLATELET
Basophils Relative: 0 % (ref 0–1)
Hemoglobin: 12.7 g/dL (ref 12.0–15.0)
Lymphocytes Relative: 7 % — ABNORMAL LOW (ref 12–46)
Lymphs Abs: 1.2 10*3/uL (ref 0.7–4.0)
Monocytes Relative: 2 % — ABNORMAL LOW (ref 3–12)
Neutro Abs: 16.1 10*3/uL — ABNORMAL HIGH (ref 1.7–7.7)
Neutrophils Relative %: 91 % — ABNORMAL HIGH (ref 43–77)
RBC: 4.34 MIL/uL (ref 3.87–5.11)
WBC: 17.7 10*3/uL — ABNORMAL HIGH (ref 4.0–10.5)

## 2012-12-17 LAB — URINALYSIS, ROUTINE W REFLEX MICROSCOPIC
Specific Gravity, Urine: 1.01 (ref 1.005–1.030)
Urobilinogen, UA: 0.2 mg/dL (ref 0.0–1.0)

## 2012-12-17 LAB — URINE MICROSCOPIC-ADD ON

## 2012-12-17 LAB — POCT PREGNANCY, URINE: Preg Test, Ur: NEGATIVE

## 2012-12-17 MED ORDER — PROMETHAZINE HCL 12.5 MG PO TABS
25.0000 mg | ORAL_TABLET | Freq: Once | ORAL | Status: AC
  Administered 2012-12-17: 25 mg via ORAL
  Filled 2012-12-17: qty 2

## 2012-12-17 MED ORDER — HYDROMORPHONE HCL PF 1 MG/ML IJ SOLN
1.0000 mg | Freq: Once | INTRAMUSCULAR | Status: AC
  Administered 2012-12-17: 1 mg via INTRAVENOUS
  Filled 2012-12-17: qty 1

## 2012-12-17 MED ORDER — DOXYCYCLINE HYCLATE 100 MG PO CAPS: 100.0000 mg | ORAL_CAPSULE | Freq: Two times a day (BID) | ORAL | Status: DC

## 2012-12-17 MED ORDER — LIDOCAINE HCL (PF) 1 % IJ SOLN
2.0000 mL | Freq: Once | INTRAMUSCULAR | Status: AC
  Administered 2012-12-17: 2 mL via INTRADERMAL
  Filled 2012-12-17: qty 5

## 2012-12-17 MED ORDER — OXYCODONE-ACETAMINOPHEN 5-325 MG PO TABS
2.0000 | ORAL_TABLET | Freq: Once | ORAL | Status: AC
  Administered 2012-12-17: 2 via ORAL
  Filled 2012-12-17: qty 2

## 2012-12-17 MED ORDER — VANCOMYCIN HCL 10 G IV SOLR
1500.0000 mg | Freq: Once | INTRAVENOUS | Status: AC
  Administered 2012-12-17: 1500 mg via INTRAVENOUS
  Filled 2012-12-17: qty 1500

## 2012-12-17 NOTE — ED Provider Notes (Signed)
History   This chart was scribed for Teresa Gaskins, MD, by Frederik Pear, ER scribe. The patient was seen in room APA11/APA11 and the patient's care was started at 1551.    CSN: [id number]  Arrival date & time 12/17/12  1452   First MD Initiated Contact with Patient 12/17/12 1551      Chief Complaint  Patient presents with  . Abscess     HPI [place] is a 27 y.o. female who presents to the Emergency Department complaining of gradually worsening, constant abscesses to her labia 3x and her left inner thigh 1x that began several weeks ago. She reports that she originally went to see her obgyn, Dr. Frederik Schmidt at [facility], for vaginal dryness and noted a small pimple that has been growing and finally ruptured 5 days ago. Since then, 3 additional abscesses have appeared. She also complains of right lower back pain and emesis. She reports that her last BM was 5 days ago. She states that she has been to her obgyn twice, [facility] where she had a culture performed, but has not received the results, and recent ED evaluation here.  She reports that she has been taking clindamycin at home PTA. She has a h/o of undifferentiated connective tissue disease.   Past Medical History  Diagnosis Date  . Undifferentiated connective tissue disease   . Hypertension   . Immune deficiency disorder     Past Surgical History  Procedure Date  . Tubal ligation April 2013  . Ablation April 2013  . Cesarean section May 2012  . Esophagogastroduodenoscopy 09/25/2012    SLF: The mucosa of the esophagus appeared normal/ Non-erosive gastritis (inflammation) was found in the gastric antrum; multiple biopsies /The duodenal mucosa showed no abnormalities in the bulb and second portion of the duodenum; PATH: benign small bowel, chronic focally active gastritis    Family History  Problem Relation Age of Onset  . Colon cancer Neg Hx     History  Substance Use Topics  . Smoking status: Former Games developer   . Smokeless tobacco: Not on file     Comment: Quit in 2006  . Alcohol Use: No    OB History    Grav Para Term Preterm Abortions TAB SAB Ect Mult Living                  Review of Systems  Gastrointestinal: Positive for vomiting.  Genitourinary: Positive for genital sores.  Musculoskeletal: Positive for back pain.  All other systems reviewed and are negative.    Allergies  Diflucan; Reglan; Sulfa antibiotics; Hydrocodone; and Topamax  Home Medications   Current Outpatient Rx  Name  Route  Sig  Dispense  Refill  . CETIRIZINE HCL 10 MG PO TABS   Oral   Take 10 mg by mouth daily. As needed         . CLINDAMYCIN HCL 300 MG PO CAPS   Oral   Take 300 mg by mouth 3 (three) times daily. Started on 12/15/2012         . CYCLOBENZAPRINE HCL 10 MG PO TABS   Oral   Take 10 mg by mouth 3 (three) times daily as needed. Only as needed muscle spasm         . FOLIC ACID 1 MG PO TABS   Oral   Take 1 mg by mouth daily.         Marland Kitchen HYDROXYCHLOROQUINE SULFATE 200 MG PO TABS   Oral  Take 200 mg by mouth 2 (two) times daily.          . IBUPROFEN 200 MG PO TABS   Oral   Take 600 mg by mouth every 6 (six) hours as needed. pain         . LISINOPRIL 10 MG PO TABS   Oral   Take 10 mg by mouth daily.         Marland Kitchen METHOTREXATE SODIUM 2.5 MG PO TABS   Oral   Take 15 mg by mouth once a week. Takes 6 tablets once weekly on Monday         . METOPROLOL TARTRATE 25 MG PO TABS   Oral   Take 25 mg by mouth daily.          Marland Kitchen OMEPRAZOLE 20 MG PO CPDR   Oral   Take 20 mg by mouth daily. 1 po bid 30 minutes before meals for 3 mos then once daily FOREVER         . OXYCODONE-ACETAMINOPHEN 5-325 MG PO TABS   Oral   Take 1 tablet by mouth every 4 (four) hours as needed. pain         . PREDNISONE 5 MG PO TABS   Oral   Take 10 mg by mouth daily.          Marland Kitchen PROMETHAZINE HCL 25 MG RE SUPP   Rectal   Place 1 suppository (25 mg total) rectally every 6 (six) hours as needed  for nausea.   12 each   0   . SPIRONOLACTONE 25 MG PO TABS   Oral   Take 25 mg by mouth daily.           BP 134/91  Pulse 125  Temp 98.1 F (36.7 C) (Oral)  Resp 17  SpO2 98%  LMP 12/16/2012 BP 112/74  Pulse 92  Temp 98.1 F (36.7 C) (Oral)  Resp 17  SpO2 96%  LMP 12/16/2012  Physical Exam CONSTITUTIONAL: Well developed/well nourished HEAD AND FACE: Normocephalic/atraumatic EYES: EOMI/PERRL ENMT: Mucous membranes moist NECK: supple no meningeal signs SPINE:entire spine nontender CV: S1/S2 noted, no murmurs/rubs/gallops noted LUNGS: Lungs are clear to auscultation bilaterally, no apparent distress ABDOMEN: soft, nontender, no rebound or guarding GU:no cva tenderness, no bartholin cyst noted, no cellulitis or induration noted to external genitalia, chaperoned exam NEURO: Pt is awake/alert, moves all extremitiesx4 EXTREMITIES: pulses normal, full ROM, abscess with induration on proximal, medial left thigh (2cm in diameter), no crepitance or streaking There is no extension into buttocks or perineum.  Chaperone present SKIN: warm, color normal PSYCH: no abnormalities of mood noted ED Course  [place] bedside Date/Time: 12/17/2012 5:14 PM Performed by: Teresa Berg Authorized by: Teresa Berg Consent: Verbal consent obtained. Patient tolerance: Patient tolerated the procedure well with no immediate complications. Comments: Bedside ultrasound to confirm abscess on left inner thigh- small amt of pus noted on ultrasound.  Images archived    NEEDLE DRAINAGE OF ABSCESS Performed by: Teresa Berg Consent: Verbal consent obtained. Risks and benefits: risks, benefits and alternatives were discussed Type: abscess  Body area: LEFT MEDIAL THIGH  Anesthesia: local infiltration  18G NEEDLE UTILIZED  Local anesthetic: lidocaine 1%   Anesthetic total: 3 ml  Drainage: BLOOD  Patient tolerance: Patient tolerated the procedure well with no immediate  complications.     DIAGNOSTIC STUDIES: Oxygen Saturation is 98% on room air, normal by my interpretation.    COORDINATION OF CARE:  15:56- Discussed planned course  of treatment with the patient, including blood work, a UA, Vaococin, and Dilaudid, who is agreeable at this time.  16:15- Medication Orders- vancomycin (vancocin) 1,500 mg in sodium chloride 0.9% 5500 mL IVPB once, hdyromorphone (dilaudid) injection 1 mg- once.   MINIMAL PUS EXTRACTED FROM NEEDLE DRAINAGE (POCKET OF PUS ON ULTRASOUND NOT FOUND CLINICALLY) SHE WILL CONTINUE CLINDAMYCIN (SHE HAD BEEN VOMITING IT AND NOW CAN TOLERATE) BUT IF NO IMPROVEMENT SHE WILL START DOXYCYCLINE SHE HAS PAIN MEDS AT HOME   MDM  Nursing notes including past medical history and social history reviewed and considered in documentation Labs/vital reviewed and considered Previous records reviewed and considered - recent ED visit reviewed   I personally performed the services described in this documentation, which was scribed in my presence. The recorded information has been reviewed and is accurate.          Teresa Gaskins, MD 12/17/12 [phone]

## 2012-12-17 NOTE — ED Notes (Signed)
Pt reports having 3 abscess on her labia and 1 abscess on her left inner thigh.  Pt reports being seen here yesterday and given IV Vancomycin.  Pt reports abscess on her left inner thigh has gotten bigger and severely painful.  Pt reports having culture done at [facility] but has not received results.

## 2014-01-23 ENCOUNTER — Encounter (HOSPITAL_COMMUNITY): Payer: Self-pay | Admitting: Emergency Medicine

## 2014-01-23 ENCOUNTER — Emergency Department (HOSPITAL_COMMUNITY)
Admission: EM | Admit: 2014-01-23 | Discharge: 2014-01-23 | Disposition: A | Discharge status: Home / Self Care | Payer: BC Managed Care – PPO | Attending: Emergency Medicine | Admitting: Emergency Medicine

## 2014-01-23 DIAGNOSIS — Z79899 Other long term (current) drug therapy: Secondary | ICD-10-CM | POA: Insufficient documentation

## 2014-01-23 DIAGNOSIS — Z792 Long term (current) use of antibiotics: Secondary | ICD-10-CM | POA: Insufficient documentation

## 2014-01-23 DIAGNOSIS — Z862 Personal history of diseases of the blood and blood-forming organs and certain disorders involving the immune mechanism: Secondary | ICD-10-CM | POA: Insufficient documentation

## 2014-01-23 DIAGNOSIS — Z9851 Tubal ligation status: Secondary | ICD-10-CM | POA: Insufficient documentation

## 2014-01-23 DIAGNOSIS — IMO0002 Reserved for concepts with insufficient information to code with codable children: Secondary | ICD-10-CM | POA: Insufficient documentation

## 2014-01-23 DIAGNOSIS — R3919 Other difficulties with micturition: Secondary | ICD-10-CM | POA: Insufficient documentation

## 2014-01-23 DIAGNOSIS — R110 Nausea: Secondary | ICD-10-CM | POA: Insufficient documentation

## 2014-01-23 DIAGNOSIS — Z8639 Personal history of other endocrine, nutritional and metabolic disease: Secondary | ICD-10-CM | POA: Insufficient documentation

## 2014-01-23 DIAGNOSIS — I10 Essential (primary) hypertension: Secondary | ICD-10-CM | POA: Insufficient documentation

## 2014-01-23 DIAGNOSIS — M549 Dorsalgia, unspecified: Secondary | ICD-10-CM | POA: Insufficient documentation

## 2014-01-23 DIAGNOSIS — R109 Unspecified abdominal pain: Secondary | ICD-10-CM | POA: Insufficient documentation

## 2014-01-23 DIAGNOSIS — M329 Systemic lupus erythematosus, unspecified: Secondary | ICD-10-CM

## 2014-01-23 DIAGNOSIS — Z87891 Personal history of nicotine dependence: Secondary | ICD-10-CM | POA: Insufficient documentation

## 2014-01-23 LAB — CBC WITH DIFFERENTIAL/PLATELET
BASOS ABS: 0.2 10*3/uL — AB (ref 0.0–0.1)
Basophils Relative: 1 % (ref 0–1)
EOS PCT: 1 % (ref 0–5)
Eosinophils Absolute: 0.2 10*3/uL (ref 0.0–0.7)
HEMATOCRIT: 41.4 % (ref 36.0–46.0)
Hemoglobin: 13.7 g/dL (ref 12.0–15.0)
LYMPHS ABS: 7.3 10*3/uL — AB (ref 0.7–4.0)
Lymphocytes Relative: 38 % (ref 12–46)
MCH: 28.4 pg (ref 26.0–34.0)
MCHC: 33.1 g/dL (ref 30.0–36.0)
MCV: 85.7 fL (ref 78.0–100.0)
MONO ABS: 1.5 10*3/uL — AB (ref 0.1–1.0)
Monocytes Relative: 8 % (ref 3–12)
Neutro Abs: 10.1 10*3/uL — ABNORMAL HIGH (ref 1.7–7.7)
Neutrophils Relative %: 52 % (ref 43–77)
PLATELETS: 402 10*3/uL — AB (ref 150–400)
RBC: 4.83 MIL/uL (ref 3.87–5.11)
RDW: 14.8 % (ref 11.5–15.5)
WBC: 19.3 10*3/uL — ABNORMAL HIGH (ref 4.0–10.5)

## 2014-01-23 LAB — BASIC METABOLIC PANEL
BUN: 14 mg/dL (ref 6–23)
CALCIUM: 9.5 mg/dL (ref 8.4–10.5)
CO2: 24 mEq/L (ref 19–32)
CREATININE: 0.81 mg/dL (ref 0.50–1.10)
Chloride: 102 mEq/L (ref 96–112)
GFR calc Af Amer: >90 mL/min (ref >90)
GFR calc non Af Amer: >90 mL/min (ref >90)
GLUCOSE: 112 mg/dL — AB (ref 70–99)
Potassium: 4.3 mEq/L (ref 3.7–5.3)
SODIUM: 139 meq/L (ref 137–147)

## 2014-01-23 LAB — URINALYSIS, ROUTINE W REFLEX MICROSCOPIC
Bilirubin Urine: NEGATIVE
GLUCOSE, UA: NEGATIVE mg/dL
HGB URINE DIPSTICK: NEGATIVE
KETONES UR: NEGATIVE mg/dL
Leukocytes, UA: NEGATIVE
Nitrite: NEGATIVE
PROTEIN: NEGATIVE mg/dL
Specific Gravity, Urine: 1.02 (ref 1.005–1.030)
Urobilinogen, UA: 0.2 mg/dL (ref 0.0–1.0)
pH: 6 (ref 5.0–8.0)

## 2014-01-23 MED ORDER — METHYLPREDNISOLONE SODIUM SUCC 125 MG IJ SOLR
125.0000 mg | Freq: Once | INTRAMUSCULAR | Status: AC
  Administered 2014-01-23: 125 mg via INTRAMUSCULAR
  Filled 2014-01-23: qty 2

## 2014-01-23 MED ORDER — OXYCODONE-ACETAMINOPHEN 5-325 MG PO TABS: 2.0000 | ORAL_TABLET | ORAL | Status: AC | PRN

## 2014-01-23 MED ORDER — DIAZEPAM 5 MG PO TABS
5.0000 mg | ORAL_TABLET | Freq: Once | ORAL | Status: AC
  Administered 2014-01-23: 5 mg via ORAL
  Filled 2014-01-23: qty 1

## 2014-01-23 MED ORDER — MORPHINE SULFATE 4 MG/ML IJ SOLN
6.0000 mg | Freq: Once | INTRAMUSCULAR | Status: AC
  Administered 2014-01-23: 6 mg via INTRAMUSCULAR
  Filled 2014-01-23: qty 2

## 2014-01-23 MED ORDER — ONDANSETRON 4 MG PO TBDP
4.0000 mg | ORAL_TABLET | Freq: Once | ORAL | Status: AC
  Administered 2014-01-23: 4 mg via ORAL
  Filled 2014-01-23: qty 1

## 2014-01-23 NOTE — ED Provider Notes (Signed)
CSN: [id number]     Arrival date & time 01/23/14  1521 History  This chart was scribed for Teresa Baker, MD by Dorothey Baseman, ED Scribe. This patient was seen in room APA07/APA07 and the patient's care was started at 4:58 PM.    Chief Complaint  Patient presents with  . Urinary Retention   The history is provided by the patient. No language interpreter was used.   HPI Comments: [place] is a 28 y.o. female with a history of lupus who presents to the Emergency Department complaining of intermittent urinary retention with associated pain to the mid-back and into the lower region of the abdomen onset about a week ago that she states feels similar to her past lupus flare ups, but are not as severe. She reports some associated, diffuse swelling secondary to the retention and some mild nausea secondary to pain. Patient states that she was seen at [facility] about a week ago for similar complaints and received UA that was normal. She was given a steroid injection and an increased dosage of a prednisone taper (patient usually takes 10 mg daily), which she states was able to provide temporary relief, but that her symptoms returned 3 days ago. Patient reports taking Tylenol and ibuprofen at home with mild, temporary relief. Patient also takes 25 mg Aldactone daily and denies any recent changes in her medications. She states that she has an appointment with her rheumatologist at [facility] in 3 days. She denies recent fevers, cough, congestion, lightheadedness. Patient has allergies to Diflucan, Reglan, hydrocodone, trazodone and nefazodone, sulfa antibiotics, and Topamax. Patient also has a history of HTN.   Past Medical History  Diagnosis Date  . Undifferentiated connective tissue disease   . Hypertension   . Immune deficiency disorder    Past Surgical History  Procedure Laterality Date  . Tubal ligation  April 2013  . Ablation  April 2013  . Cesarean section  May 2012  .  Esophagogastroduodenoscopy  09/25/2012    SLF: The mucosa of the esophagus appeared normal/ Non-erosive gastritis (inflammation) was found in the gastric antrum; multiple biopsies /The duodenal mucosa showed no abnormalities in the bulb and second portion of the duodenum; PATH: benign small bowel, chronic focally active gastritis  . Tympanostomy tube placement     Family History  Problem Relation Age of Onset  . Colon cancer Neg Hx   . Hypertension Mother   . Diabetes Father   . Hypertension Father   . Stroke Father   . Vasculitis Father    History  Substance Use Topics  . Smoking status: Former Smoker -- 0.02 packs/day for 2 years    Types: Cigarettes    Quit date: 11/18/2004  . Smokeless tobacco: Never Used     Comment: Quit in 2006  . Alcohol Use: No   OB History   Grav Para Term Preterm Abortions TAB SAB Ect Mult Living   4 4 2 2      4      Review of Systems  Constitutional: Negative for fever.  HENT: Negative for congestion.   Respiratory: Negative for cough.   Gastrointestinal: Positive for nausea and abdominal pain.  Genitourinary: Positive for decreased urine volume.  Musculoskeletal: Positive for back pain.       Diffuse swelling  Neurological: Negative for light-headedness.  All other systems reviewed and are negative.   Allergies  Diflucan; Reglan; Sulfa antibiotics; Hydrocodone; Topamax; and Trazodone and nefazodone  Home Medications   Current Outpatient Rx  Name  Route  Sig  Dispense  Refill  . cetirizine (ZYRTEC) 10 MG tablet   Oral   Take 10 mg by mouth daily. As needed         . clindamycin (CLEOCIN) 300 MG capsule   Oral   Take 300 mg by mouth 3 (three) times daily. Started on 12/15/2012         . cyclobenzaprine (FLEXERIL) 10 MG tablet   Oral   Take 10 mg by mouth 3 (three) times daily as needed. Only as needed muscle spasm         . doxycycline (VIBRAMYCIN) 100 MG capsule   Oral   Take 1 capsule (100 mg total) by mouth 2 (two) times  daily.   20 capsule   0   . folic acid (FOLVITE) 1 MG tablet   Oral   Take 1 mg by mouth daily.         . hydroxychloroquine (PLAQUENIL) 200 MG tablet   Oral   Take 200 mg by mouth 2 (two) times daily.          Marland Kitchen. ibuprofen (ADVIL,MOTRIN) 200 MG tablet   Oral   Take 600 mg by mouth every 6 (six) hours as needed. pain         . lisinopril (PRINIVIL,ZESTRIL) 10 MG tablet   Oral   Take 10 mg by mouth daily.         . methotrexate 2.5 MG tablet   Oral   Take 15 mg by mouth once a week. Takes 6 tablets once weekly on Monday         . metoprolol tartrate (LOPRESSOR) 25 MG tablet   Oral   Take 25 mg by mouth daily.          Marland Kitchen. omeprazole (PRILOSEC) 20 MG capsule   Oral   Take 20 mg by mouth daily. 1 po bid 30 minutes before meals for 3 mos then once daily FOREVER         . oxyCODONE-acetaminophen (PERCOCET/ROXICET) 5-325 MG per tablet   Oral   Take 1 tablet by mouth every 4 (four) hours as needed. pain         . predniSONE (DELTASONE) 5 MG tablet   Oral   Take 10 mg by mouth daily.          . promethazine (PHENERGAN) 25 MG suppository   Rectal   Place 1 suppository (25 mg total) rectally every 6 (six) hours as needed for nausea.   12 each   0   . spironolactone (ALDACTONE) 25 MG tablet   Oral   Take 25 mg by mouth daily.          Triage Vitals: BP 126/83  Pulse 112  Temp(Src) 97.8 F (36.6 C) (Oral)  Resp 20  Ht 5\' 3"  (1.6 m)  Wt 220 lb (99.791 kg)  BMI 38.98 kg/m2  SpO2 100%  LMP 11/19/2013  Physical Exam  Nursing note and vitals reviewed. Constitutional: She is oriented to person, place, and time. She appears well-developed and well-nourished.  Non-toxic appearance. No distress.  HENT:  Head: Normocephalic and atraumatic.  Eyes: Conjunctivae, EOM and lids are normal. Pupils are equal, round, and reactive to light.  Neck: Normal range of motion. Neck supple. No tracheal deviation present. No mass present.  Cardiovascular: Normal rate,  regular rhythm and normal heart sounds.  Exam reveals no gallop.   No murmur heard. Pulmonary/Chest: Effort normal and breath sounds normal. No stridor.  No respiratory distress. She has no decreased breath sounds. She has no wheezes. She has no rhonchi. She has no rales.  Abdominal: Soft. Normal appearance and bowel sounds are normal. She exhibits no distension. There is no tenderness. There is no rebound and no CVA tenderness.  Musculoskeletal: Normal range of motion. She exhibits no edema and no tenderness.  No edema to bilateral lower extremities.   Neurological: She is alert and oriented to person, place, and time. She has normal strength. No cranial nerve deficit or sensory deficit. GCS eye subscore is 4. GCS verbal subscore is 5. GCS motor subscore is 6.  Skin: Skin is warm and dry. No abrasion and no rash noted.  Psychiatric: She has a normal mood and affect. Her speech is normal and behavior is normal.    ED Course  Procedures (including critical care time)  DIAGNOSTIC STUDIES: Oxygen Saturation is 100% on room air, normal by my interpretation.    COORDINATION OF CARE: 5:05 PM- Discussed that lab results indicate an elevated WBC count, which is normal given her steroid use, and her labs were otherwise normal. Will order Solu-Medrol, morphine, and Valium and discharge patient with a short course of Percocet to manage symptoms until patient can follow up with her rheumatologist. Discussed treatment plan with patient at bedside and patient verbalized agreement.     Labs Review Labs Reviewed  CBC WITH DIFFERENTIAL - Abnormal; Notable for the following:    WBC 19.3 (*)    Platelets 402 (*)    Neutro Abs 10.1 (*)    Lymphs Abs 7.3 (*)    Monocytes Absolute 1.5 (*)    Basophils Absolute 0.2 (*)    All other components within normal limits  BASIC METABOLIC PANEL - Abnormal; Notable for the following:    Glucose, Bld 112 (*)    All other components within normal limits  URINALYSIS,  ROUTINE W REFLEX MICROSCOPIC   Imaging Review No results found.   EKG Interpretation None      MDM   Final diagnoses:  None    Medical decision making is as above. Do not believe the patient has any acute infectious process at this time. Current symptoms are likely due to her lupus and this is an exacerbation. She will see her rheumatologist in 48 hours.  I personally performed the services described in this documentation, which was scribed in my presence. The recorded information has been reviewed and is accurate.      Teresa Baker, MD 01/23/14 [phone]

## 2014-01-23 NOTE — Discharge Instructions (Signed)
Call your rheumatologist tomorrow for recommendations on adjusting your steroids

## 2014-01-23 NOTE — ED Notes (Signed)
Pt c/o nausea, Dr Isaias CowmanAllan notified, additional orders given

## 2014-01-23 NOTE — ED Notes (Signed)
Patient has lupus. Per patient 1 week ago started having severe pain in mid back radiating into abd and pelvic region. Patient stated "It felt someone was ripping my kidneys out." Patient went to [facility] and urine was checked, no UTI. Patient given injection of steroid and was given pred pack. Per patient started feeling better. Per patient severe pain returned on Friday with retention and swelling throughout body. [facility] but was told to at [facility] this afternoon that she needed to come to ER to have blood work checked and possible x-rays.

## 2014-01-23 NOTE — ED Notes (Signed)
Dr Isaias CowmanAllan in prior to RN, see edp assessment for further,

## 2014-01-24 LAB — PATHOLOGIST SMEAR REVIEW

## 2014-09-19 ENCOUNTER — Encounter (HOSPITAL_COMMUNITY): Payer: Self-pay | Admitting: Emergency Medicine

## 2014-10-19 ENCOUNTER — Telehealth: Payer: Self-pay | Admitting: Family Medicine

## 2014-10-19 NOTE — Telephone Encounter (Signed)
error
